# Patient Record
Sex: Male | Born: 2000 | Race: White | Hispanic: No | Marital: Single | Smoking: Current every day smoker
Health system: Southern US, Community
[De-identification: ages and names within clinical notes are randomized; demographics above are authoritative.]

---
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
## 2020-07-03 ENCOUNTER — Other Ambulatory Visit: Payer: Self-pay

## 2020-07-03 ENCOUNTER — Emergency Department (HOSPITAL_COMMUNITY)
Admission: EM | Admit: 2020-07-03 | Discharge: 2020-07-03 | Disposition: A | Discharge status: Home / Self Care | Payer: BC Managed Care – PPO | Attending: Emergency Medicine | Admitting: Emergency Medicine

## 2020-07-03 ENCOUNTER — Encounter (HOSPITAL_COMMUNITY): Payer: Self-pay

## 2020-07-03 DIAGNOSIS — R1013 Epigastric pain: Secondary | ICD-10-CM | POA: Insufficient documentation

## 2020-07-03 DIAGNOSIS — E860 Dehydration: Secondary | ICD-10-CM | POA: Insufficient documentation

## 2020-07-03 DIAGNOSIS — F17290 Nicotine dependence, other tobacco product, uncomplicated: Secondary | ICD-10-CM | POA: Diagnosis not present

## 2020-07-03 DIAGNOSIS — R112 Nausea with vomiting, unspecified: Secondary | ICD-10-CM | POA: Diagnosis present

## 2020-07-03 LAB — CBC
HCT: 42.6 % (ref 39.0–52.0)
Hemoglobin: 14.7 g/dL (ref 13.0–17.0)
MCH: 29.6 pg (ref 26.0–34.0)
MCHC: 34.5 g/dL (ref 30.0–36.0)
MCV: 85.7 fL (ref 80.0–100.0)
Platelets: 317 10*3/uL (ref 150–400)
RBC: 4.97 MIL/uL (ref 4.22–5.81)
RDW: 12.4 % (ref 11.5–15.5)
WBC: 7.4 10*3/uL (ref 4.0–10.5)
nRBC: 0 % (ref 0.0–0.2)

## 2020-07-03 LAB — COMPREHENSIVE METABOLIC PANEL
ALT: 16 U/L (ref 0–44)
AST: 19 U/L (ref 15–41)
Albumin: 4.5 g/dL (ref 3.5–5.0)
Alkaline Phosphatase: 76 U/L (ref 38–126)
Anion gap: 8 (ref 5–15)
BUN: 19 mg/dL (ref 6–20)
CO2: 27 mmol/L (ref 22–32)
Calcium: 9.3 mg/dL (ref 8.9–10.3)
Chloride: 103 mmol/L (ref 98–111)
Creatinine, Ser: 1.17 mg/dL (ref 0.61–1.24)
GFR, Estimated: >60 mL/min (ref >60)
Glucose, Bld: 160 mg/dL — ABNORMAL HIGH (ref 70–99)
Potassium: 3.9 mmol/L (ref 3.5–5.1)
Sodium: 138 mmol/L (ref 135–145)
Total Bilirubin: 1.5 mg/dL — ABNORMAL HIGH (ref 0.3–1.2)
Total Protein: 7.2 g/dL (ref 6.5–8.1)

## 2020-07-03 LAB — URINALYSIS, ROUTINE W REFLEX MICROSCOPIC
Bilirubin Urine: NEGATIVE
Glucose, UA: NEGATIVE mg/dL
Hgb urine dipstick: NEGATIVE
Ketones, ur: 5 mg/dL — AB
Leukocytes,Ua: NEGATIVE
Nitrite: NEGATIVE
Protein, ur: NEGATIVE mg/dL
Specific Gravity, Urine: 1.027 (ref 1.005–1.030)
pH: 6 (ref 5.0–8.0)

## 2020-07-03 LAB — LIPASE, BLOOD: Lipase: 24 U/L (ref 11–51)

## 2020-07-03 MED ORDER — SODIUM CHLORIDE 0.9 % IV BOLUS
1000.0000 mL | Freq: Once | INTRAVENOUS | Status: AC
  Administered 2020-07-03: 1000 mL via INTRAVENOUS

## 2020-07-03 MED ORDER — FAMOTIDINE 20 MG PO TABS: 20.0000 mg | ORAL_TABLET | Freq: Every day | ORAL | 0 refills | Status: AC

## 2020-07-03 MED ORDER — FAMOTIDINE IN NACL 20-0.9 MG/50ML-% IV SOLN
20.0000 mg | Freq: Once | INTRAVENOUS | Status: AC
  Administered 2020-07-03: 20 mg via INTRAVENOUS
  Filled 2020-07-03: qty 50

## 2020-07-03 MED ORDER — ONDANSETRON 4 MG PO TBDP: 4.0000 mg | ORAL_TABLET | Freq: Three times a day (TID) | ORAL | 0 refills | Status: AC | PRN

## 2020-07-03 NOTE — ED Triage Notes (Signed)
Severe headache that began yesterday, states some blurry vision and 3 episodes of vomiting. Here today d/t left lower abd pain that is worse after eating. No hx of problem. Tylenol with some relief last night.

## 2020-07-03 NOTE — Discharge Instructions (Signed)
Your blood sugar was slightly elevated today at 160.  I suspect that this is just from the stress from your illness.  You will need to get this rechecked by your pcp when you are feeling well.

## 2020-07-03 NOTE — ED Provider Notes (Signed)
[facility] EMERGENCY DEPARTMENT Provider Note   CSN: [id number] Arrival date & time: 07/03/20  1752     History Chief Complaint  Patient presents with  . Abdominal Pain    Ronald Cunningham is a 20 y.o. male.  Pt presents to the ED today with a headache which started last night.  He then had some n/v with some blood in it.  He then developed abdominal pain which worsened after eating.  His headache is gone now.  He no longer feels nauseous.        History reviewed. No pertinent past medical history.  There are no problems to display for this patient.   History reviewed. No pertinent surgical history.     History reviewed. No pertinent family history.  Social History   Tobacco Use  . Smoking status: Current Every Day Smoker    Types: E-cigarettes  . Smokeless tobacco: Never Used  Substance Use Topics  . Alcohol use: Yes    Comment: occ    Home Medications Prior to Admission medications   Medication Sig Start Date End Date Taking? Authorizing Provider  famotidine (PEPCID) 20 MG tablet Take 1 tablet (20 mg total) by mouth daily. 07/03/20  Yes Jacalyn Lefevre, MD  ondansetron (ZOFRAN ODT) 4 MG disintegrating tablet Take 1 tablet (4 mg total) by mouth every 8 (eight) hours as needed for nausea or vomiting. 07/03/20  Yes Jacalyn Lefevre, MD  albuterol (PROVENTIL HFA;VENTOLIN HFA) 108 (90 BASE) MCG/ACT inhaler Inhale 2 puffs into the lungs every 6 (six) hours as needed for wheezing. 07/20/14   Babs Sciara, MD  predniSONE (DELTASONE) 20 MG tablet 3qd for 2d then 2qd for 2d then 1qd for 2d 07/19/14   Babs Sciara, MD    Allergies    Patient has no known allergies.  Review of Systems   Review of Systems  Gastrointestinal: Positive for abdominal pain, nausea and vomiting.  Neurological: Positive for headaches.  All other systems reviewed and are negative.   Physical Exam Updated Vital Signs BP 129/79 (BP Location: Right Arm)   Pulse 71   Temp 98.4 F (36.9 C)  (Oral)   Resp 16   Ht 5\' 8"  (1.727 m)   Wt 68 kg   SpO2 98%   BMI 22.81 kg/m   Physical Exam Vitals and nursing note reviewed.  Constitutional:      Appearance: He is well-developed.  HENT:     Head: Normocephalic and atraumatic.     Mouth/Throat:     Mouth: Mucous membranes are dry.  Cardiovascular:     Rate and Rhythm: Normal rate and regular rhythm.  Pulmonary:     Effort: Pulmonary effort is normal.     Breath sounds: Normal breath sounds.  Abdominal:     General: Abdomen is flat. Bowel sounds are normal.     Palpations: Abdomen is soft.     Tenderness: There is abdominal tenderness in the epigastric area.  Skin:    General: Skin is warm.     Capillary Refill: Capillary refill takes less than 2 seconds.  Neurological:     General: No focal deficit present.     Mental Status: He is alert and oriented to person, place, and time.  Psychiatric:        Mood and Affect: Mood normal.        Behavior: Behavior normal.     ED Results / Procedures / Treatments   Labs (all labs ordered are listed, but only  abnormal results are displayed) Labs Reviewed  COMPREHENSIVE METABOLIC PANEL - Abnormal; Notable for the following components:      Result Value   Glucose, Bld 160 (*)    Total Bilirubin 1.5 (*)    All other components within normal limits  URINALYSIS, ROUTINE W REFLEX MICROSCOPIC - Abnormal; Notable for the following components:   Ketones, ur 5 (*)    All other components within normal limits  LIPASE, BLOOD  CBC    EKG None  Radiology No results found.  Procedures Procedures   Medications Ordered in ED Medications  sodium chloride 0.9 % bolus 1,000 mL (0 mLs Intravenous Stopped 07/03/20 2022)  famotidine (PEPCID) IVPB 20 mg premix (0 mg Intravenous Stopped 07/03/20 2023)    ED Course  I have reviewed the triage vital signs and the nursing notes.  Pertinent labs & imaging results that were available during my care of the patient were reviewed by me and  considered in my medical decision making (see chart for details).    MDM Rules/Calculators/A&P                          Pt is given IVFs and IV pepcid.  He did not want anything for pain or nausea.  BS slightly elevated at 160.  I suspect this is from the stress of the illness.  No evidence for dka and no dm hx.  Otherwise labs are nl.  Urine shows ketones, but no infection.  Suspect blood in emesis was from a small mallory weiss tear.  hgb and vitals are nl.   Pt is to get this re-checked by his pcp when he is feeling well.  Pt is feeling much better after fluids.  He is able to tolerate po fluids.  He is stable for d/c.  Return if worse.  Final Clinical Impression(s) / ED Diagnoses Final diagnoses:  Epigastric pain  Dehydration    Rx / DC Orders ED Discharge Orders         Ordered    ondansetron (ZOFRAN ODT) 4 MG disintegrating tablet  Every 8 hours PRN        07/03/20 2039    famotidine (PEPCID) 20 MG tablet  Daily        07/03/20 2039           Jacalyn Lefevre, MD 07/03/20 2105

## 2022-07-12 ENCOUNTER — Emergency Department (HOSPITAL_BASED_OUTPATIENT_CLINIC_OR_DEPARTMENT_OTHER)
Admission: EM | Admit: 2022-07-12 | Discharge: 2022-07-12 | Disposition: A | Discharge status: Home / Self Care | Payer: BC Managed Care – PPO | Attending: Emergency Medicine | Admitting: Emergency Medicine

## 2022-07-12 ENCOUNTER — Encounter (HOSPITAL_BASED_OUTPATIENT_CLINIC_OR_DEPARTMENT_OTHER): Payer: Self-pay | Admitting: *Deleted

## 2022-07-12 ENCOUNTER — Other Ambulatory Visit: Payer: Self-pay

## 2022-07-12 DIAGNOSIS — W57XXXA Bitten or stung by nonvenomous insect and other nonvenomous arthropods, initial encounter: Secondary | ICD-10-CM | POA: Insufficient documentation

## 2022-07-12 DIAGNOSIS — S80261A Insect bite (nonvenomous), right knee, initial encounter: Secondary | ICD-10-CM | POA: Diagnosis present

## 2022-07-12 LAB — COMPREHENSIVE METABOLIC PANEL
ALT: 43 U/L (ref 0–44)
AST: 40 U/L (ref 15–41)
Albumin: 4.8 g/dL (ref 3.5–5.0)
Alkaline Phosphatase: 86 U/L (ref 38–126)
Anion gap: 12 (ref 5–15)
BUN: 13 mg/dL (ref 6–20)
CO2: 26 mmol/L (ref 22–32)
Calcium: 9.8 mg/dL (ref 8.9–10.3)
Chloride: 98 mmol/L (ref 98–111)
Creatinine, Ser: 1.12 mg/dL (ref 0.61–1.24)
GFR, Estimated: >60 mL/min (ref >60)
Glucose, Bld: 87 mg/dL (ref 70–99)
Potassium: 4 mmol/L (ref 3.5–5.1)
Sodium: 136 mmol/L (ref 135–145)
Total Bilirubin: 2.1 mg/dL — ABNORMAL HIGH (ref 0.3–1.2)
Total Protein: 7.5 g/dL (ref 6.5–8.1)

## 2022-07-12 LAB — CBC
HCT: 44.3 % (ref 39.0–52.0)
Hemoglobin: 15.8 g/dL (ref 13.0–17.0)
MCH: 29.6 pg (ref 26.0–34.0)
MCHC: 35.7 g/dL (ref 30.0–36.0)
MCV: 83.1 fL (ref 80.0–100.0)
Platelets: 213 10*3/uL (ref 150–400)
RBC: 5.33 MIL/uL (ref 4.22–5.81)
RDW: 12.2 % (ref 11.5–15.5)
WBC: 4.9 10*3/uL (ref 4.0–10.5)
nRBC: 0 % (ref 0.0–0.2)

## 2022-07-12 LAB — LIPASE, BLOOD: Lipase: 32 U/L (ref 11–51)

## 2022-07-12 LAB — LACTIC ACID, PLASMA: Lactic Acid, Venous: 1 mmol/L (ref 0.5–1.9)

## 2022-07-12 MED ORDER — ACETAMINOPHEN 500 MG PO TABS
1000.0000 mg | ORAL_TABLET | Freq: Once | ORAL | Status: AC
  Administered 2022-07-12: 1000 mg via ORAL
  Filled 2022-07-12: qty 2

## 2022-07-12 MED ORDER — KETOROLAC TROMETHAMINE 30 MG/ML IJ SOLN
30.0000 mg | Freq: Once | INTRAMUSCULAR | Status: AC
  Administered 2022-07-12: 30 mg via INTRAVENOUS
  Filled 2022-07-12: qty 1

## 2022-07-12 MED ORDER — ONDANSETRON 4 MG PO TBDP: 4.0000 mg | ORAL_TABLET | Freq: Three times a day (TID) | ORAL | 0 refills | Status: AC | PRN

## 2022-07-12 MED ORDER — ONDANSETRON HCL 4 MG/2ML IJ SOLN
4.0000 mg | Freq: Once | INTRAMUSCULAR | Status: AC
  Administered 2022-07-12: 4 mg via INTRAVENOUS
  Filled 2022-07-12: qty 2

## 2022-07-12 NOTE — ED Triage Notes (Signed)
Pt has an area of infection on the back of his right knee where he had a tick bite. Pt has been seen at [facility] last pm and placed on doxy he has taken one dose and vomited it back up.  Pt has been having a fever, 103.2 at the highest.

## 2022-07-12 NOTE — Discharge Instructions (Addendum)
Please take your doxycycline as prescribed. Take tylenol/ibuprofen for pain, Zofran for nausea.  Please sign up and check your blood culture results, Lyme disease and [facility] spotted fever results.  If the blood culture is positive please return to the ER for further management.  If Lyme disease or [place] spotted fever labs are positive please continue to take doxycycline at home.  I recommend close follow-up with PCP for reevaluation.  Please do not hesitate to return to emergency department if worrisome signs symptoms we discussed become apparent.

## 2022-07-12 NOTE — ED Notes (Signed)
ED Provider at bedside. 

## 2022-07-12 NOTE — ED Provider Notes (Signed)
[place] EMERGENCY DEPARTMENT AT [facility] Provider Note   CSN: [id number] Arrival date & time: 07/12/22  1655     History  Chief Complaint  Patient presents with   Cellulitis    Ronald Cunningham is a 22 y.o. male otherwise healthy presents today for evaluation of tick bite.  Patient has an area of redness and swelling on the back of his right knee where he had a tick bite.  He denies any bleeding, pus or fluid drainage from the tick bite wound. Patient has been seen at an urgent care last night, started on doxycycline.  He has been taking the doxycycline however having trouble keeping the medication down due to vomiting.  Patient has been having a fever at home 103.2 at the highest.  He denies any chest pain, shortness of breath, bowel change, urinary symptoms, rash.  HPI   History reviewed. No pertinent past medical history. History reviewed. No pertinent surgical history.   Home Medications Prior to Admission medications   Medication Sig Start Date End Date Taking? Authorizing Provider  ondansetron (ZOFRAN-ODT) 4 MG disintegrating tablet Take 1 tablet (4 mg total) by mouth every 8 (eight) hours as needed for nausea or vomiting. 07/12/22  Yes Jeanelle Malling, PA  albuterol (PROVENTIL HFA;VENTOLIN HFA) 108 (90 BASE) MCG/ACT inhaler Inhale 2 puffs into the lungs every 6 (six) hours as needed for wheezing. 07/20/14   Babs Sciara, MD  famotidine (PEPCID) 20 MG tablet Take 1 tablet (20 mg total) by mouth daily. 07/03/20   Jacalyn Lefevre, MD  ondansetron (ZOFRAN ODT) 4 MG disintegrating tablet Take 1 tablet (4 mg total) by mouth every 8 (eight) hours as needed for nausea or vomiting. 07/03/20   Jacalyn Lefevre, MD  predniSONE (DELTASONE) 20 MG tablet 3qd for 2d then 2qd for 2d then 1qd for 2d 07/19/14   Babs Sciara, MD      Allergies    Patient has no known allergies.    Review of Systems   Review of Systems Negative except as per HPI.  Physical Exam Updated Vital Signs BP  134/67   Pulse 90   Temp (!) 100.6 F (38.1 C) (Oral)   Resp 18   SpO2 98%  Physical Exam Vitals and nursing note reviewed.  Constitutional:      Appearance: Normal appearance.  HENT:     Head: Normocephalic and atraumatic.     Mouth/Throat:     Mouth: Mucous membranes are moist.  Eyes:     General: No scleral icterus. Cardiovascular:     Rate and Rhythm: Normal rate and regular rhythm.     Pulses: Normal pulses.     Heart sounds: Normal heart sounds.  Pulmonary:     Effort: Pulmonary effort is normal.     Breath sounds: Normal breath sounds.  Abdominal:     General: Abdomen is flat.     Palpations: Abdomen is soft.     Tenderness: There is no abdominal tenderness.  Musculoskeletal:        General: No deformity.  Skin:    General: Skin is warm.     Findings: No rash.     Comments: Bullseye rash in the back of the R knee. No bleeding, pus or fluid drainage noted.  Neurological:     General: No focal deficit present.     Mental Status: He is alert.  Psychiatric:        Mood and Affect: Mood normal.     ED  Results / Procedures / Treatments   Labs (all labs ordered are listed, but only abnormal results are displayed) Labs Reviewed  COMPREHENSIVE METABOLIC PANEL - Abnormal; Notable for the following components:      Result Value   Total Bilirubin 2.1 (*)    All other components within normal limits  CULTURE, BLOOD (ROUTINE X 2)  CULTURE, BLOOD (ROUTINE X 2)  CBC  LIPASE, BLOOD  LACTIC ACID, PLASMA  LACTIC ACID, PLASMA  SPOTTED FEVER GROUP ANTIBODIES  LYME DISEASE SEROLOGY W/REFLEX    EKG None  Radiology No results found.  Procedures Procedures    Medications Ordered in ED Medications  ondansetron (ZOFRAN) injection 4 mg (4 mg Intravenous Given 07/12/22 1843)  ketorolac (TORADOL) 30 MG/ML injection 30 mg (30 mg Intravenous Given 07/12/22 1842)  acetaminophen (TYLENOL) tablet 1,000 mg (1,000 mg Oral Given 07/12/22 1856)    ED Course/ Medical Decision  Making/ A&P                             Medical Decision Making Amount and/or Complexity of Data Reviewed Labs: ordered.  Risk OTC drugs. Prescription drug management.   This patient presents to the ED for tick bite, this involves an extensive number of treatment options, and is a complaint that carries with a high risk of complications and morbidity.  The differential diagnosis includes [facility] spotted fever.  This is not an exhaustive list.  Lab tests: I ordered and personally interpreted labs.  The pertinent results include: WBC unremarkable. Hbg unremarkable. Platelets unremarkable. Electrolytes unremarkable. BUN, creatinine unremarkable.  Lactic acid negative.  Problem list/ ED course/ Critical interventions/ Medical management: HPI: See above Vital signs within normal range and stable throughout visit. Laboratory/imaging studies significant for: See above. On physical examination, patient is afebrile and appears in no acute distress.  There was a bull's-eye rash in the back of the right knee.  Presentation is consistent with Lyme disease.  Patient has been started on doxycycline.  Lyme disease and [facility] spotted fever labs sent.  Blood culture ordered and pending.  Patient is nontoxic-appearing.  Low suspicion for sepsis.  Zofran ordered for pain and nausea.  Reevaluation of patient after the medication showed as the patient improved.  Advised patient to continue his antibiotics as prescribed, return to the ER if blood culture is positive. I have reviewed the patient home medicines and have made adjustments as needed.  Cardiac monitoring/EKG: The patient was maintained on a cardiac monitor.  I personally reviewed and interpreted the cardiac monitor which showed an underlying rhythm of: sinus rhythm.  Additional history obtained: External records from outside source obtained and reviewed including: Chart review including previous notes, labs, imaging.  Consultations  obtained:  Disposition Continued outpatient therapy. Follow-up with PCP recommended for reevaluation of symptoms. Treatment plan discussed with patient.  Pt acknowledged understanding was agreeable to the plan. Worrisome signs and symptoms were discussed with patient, and patient acknowledged understanding to return to the ED if they noticed these signs and symptoms. Patient was stable upon discharge.   This chart was dictated using voice recognition software.  Despite best efforts to proofread,  errors can occur which can change the documentation meaning.          Final Clinical Impression(s) / ED Diagnoses Final diagnoses:  Tick bite, unspecified site, initial encounter    Rx / DC Orders ED Discharge Orders  Ordered    ondansetron (ZOFRAN-ODT) 4 MG disintegrating tablet  Every 8 hours PRN        07/12/22 2059              Jeanelle Malling, [place] 07/13/22 2147    Tegeler, Canary Brim, MD 07/16/22 [phone]

## 2022-07-12 NOTE — ED Notes (Signed)
RN reviewed discharge instructions with pt. Pt verbalized understanding and had no further questions. VSS upon discharge.  

## 2022-07-13 LAB — CULTURE, BLOOD (ROUTINE X 2)

## 2022-07-15 LAB — SPOTTED FEVER GROUP ANTIBODIES
Spotted Fever Group IgG: <1:64 {titer}
Spotted Fever Group IgM: <1:64 {titer}

## 2022-07-15 LAB — CULTURE, BLOOD (ROUTINE X 2): Culture: NO GROWTH

## 2022-07-16 LAB — LYME DISEASE SEROLOGY W/REFLEX: Lyme Total Antibody EIA: NEGATIVE

## 2022-07-16 LAB — CULTURE, BLOOD (ROUTINE X 2)

## 2022-07-17 LAB — CULTURE, BLOOD (ROUTINE X 2)
